# Patient Record
Sex: Male | Born: 1995 | Race: Black or African American | Hispanic: No | Marital: Single | Smoking: Never smoker
Health system: Southern US, Community
[De-identification: ages and names within clinical notes are randomized; demographics above are authoritative.]

---

## 2017-02-04 ENCOUNTER — Ambulatory Visit (HOSPITAL_COMMUNITY)
Admission: EM | Admit: 2017-02-04 | Discharge: 2017-02-04 | Disposition: A | Discharge status: Home / Self Care | Payer: Self-pay | Attending: Family Medicine | Admitting: Family Medicine

## 2017-02-04 ENCOUNTER — Encounter (HOSPITAL_COMMUNITY): Payer: Self-pay | Admitting: Emergency Medicine

## 2017-02-04 ENCOUNTER — Ambulatory Visit (INDEPENDENT_AMBULATORY_CARE_PROVIDER_SITE_OTHER): Payer: Self-pay

## 2017-02-04 DIAGNOSIS — M545 Low back pain: Secondary | ICD-10-CM

## 2017-02-04 DIAGNOSIS — S161XXA Strain of muscle, fascia and tendon at neck level, initial encounter: Secondary | ICD-10-CM

## 2017-02-04 DIAGNOSIS — M542 Cervicalgia: Secondary | ICD-10-CM
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
## 2017-02-04 NOTE — Discharge Instructions (Addendum)
You may use over the counter ibuprofen or acetaminophen as needed.  ° °
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
